# Patient Record
Sex: Male | Born: 1995 | Race: Black or African American | Hispanic: No | Marital: Single | Smoking: Current some day smoker
Health system: Southern US, Community
[De-identification: ages and names within clinical notes are randomized; demographics above are authoritative.]

---
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
## 2013-02-22 IMAGING — CR DG WRIST COMPLETE 3+V*R*
4 series · 4 of 4 positions shown · non-contrast
Comparison: None.

CLINICAL DATA: Right lateral wrist pain.

RIGHT WRIST - COMPLETE 3+ VIEW

[x wrist pa right]
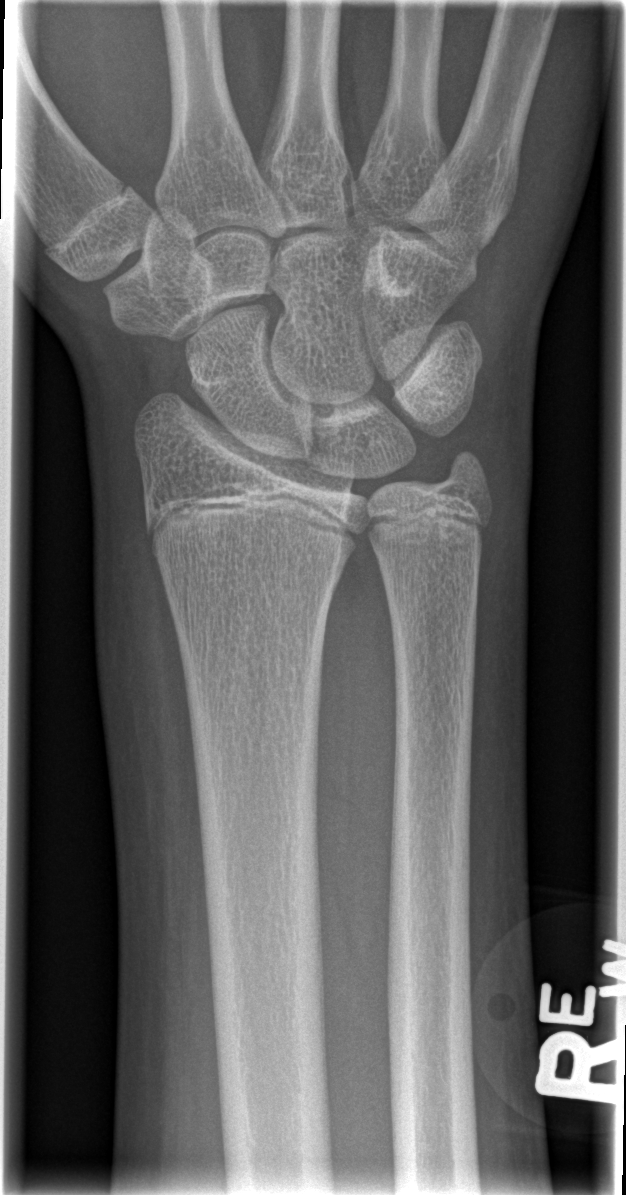

[x wrist obl right]
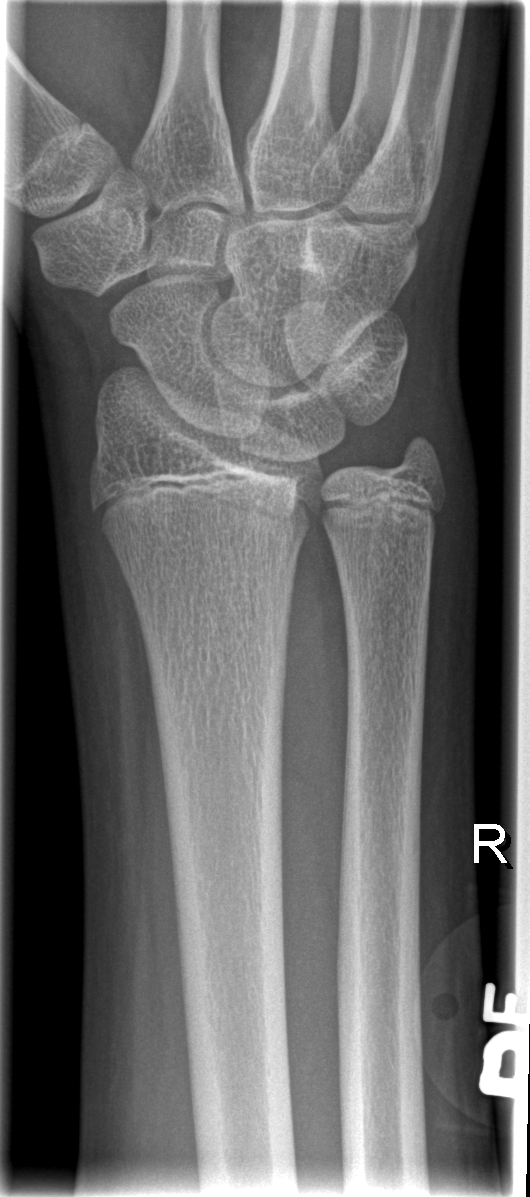

[x wrist lat right]
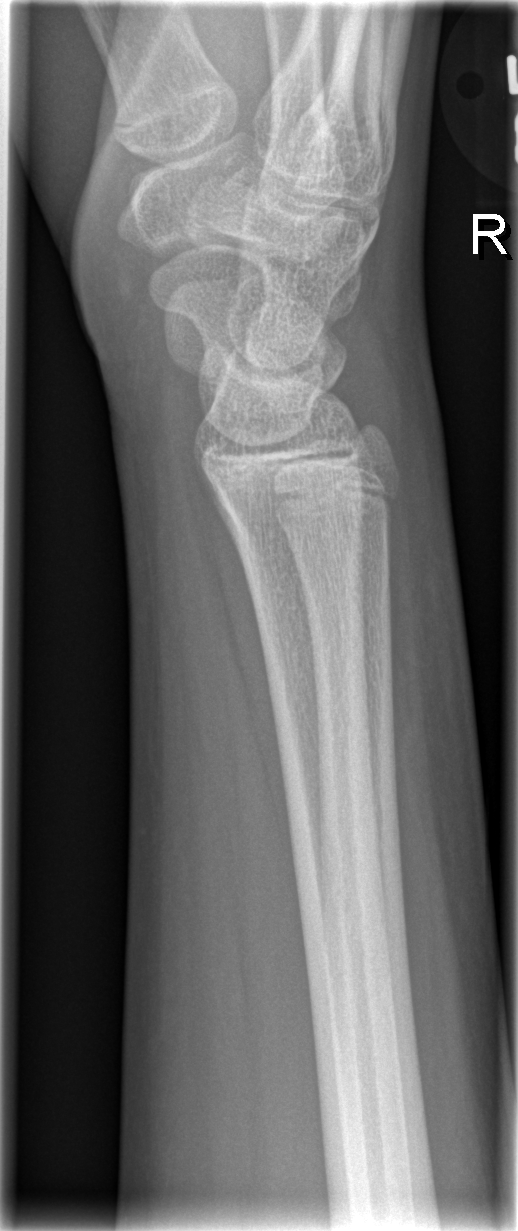

[x navicular]
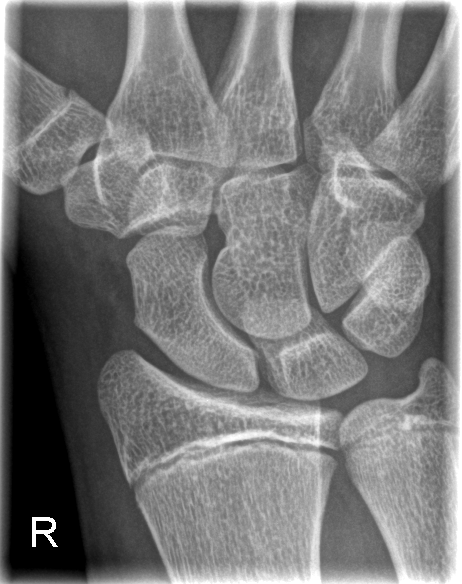

[4 of 4 positions shown; findings below may reference images not displayed]

FINDINGS: No acute osseous or joint abnormality.
IMPRESSION: No acute osseous or joint abnormality.

## 2015-06-21 ENCOUNTER — Emergency Department (HOSPITAL_COMMUNITY)
Admission: EM | Admit: 2015-06-21 | Discharge: 2015-06-21 | Disposition: A | Discharge status: Home / Self Care | Payer: Medicaid Other | Attending: Emergency Medicine | Admitting: Emergency Medicine

## 2015-06-21 ENCOUNTER — Encounter (HOSPITAL_COMMUNITY): Payer: Self-pay

## 2015-06-21 DIAGNOSIS — Z711 Person with feared health complaint in whom no diagnosis is made: Secondary | ICD-10-CM

## 2015-06-21 DIAGNOSIS — F17200 Nicotine dependence, unspecified, uncomplicated: Secondary | ICD-10-CM | POA: Insufficient documentation

## 2015-06-21 DIAGNOSIS — Z202 Contact with and (suspected) exposure to infections with a predominantly sexual mode of transmission: Secondary | ICD-10-CM | POA: Insufficient documentation

## 2015-06-21 DIAGNOSIS — R369 Urethral discharge, unspecified: Secondary | ICD-10-CM

## 2015-06-21 LAB — HIV ANTIBODY (ROUTINE TESTING W REFLEX): HIV Screen 4th Generation wRfx: NONREACTIVE

## 2015-06-21 MED ORDER — CEFTRIAXONE SODIUM 250 MG IJ SOLR
250.0000 mg | Freq: Once | INTRAMUSCULAR | Status: AC
  Administered 2015-06-21: 250 mg via INTRAMUSCULAR
  Filled 2015-06-21: qty 250

## 2015-06-21 MED ORDER — AZITHROMYCIN 250 MG PO TABS
1000.0000 mg | ORAL_TABLET | Freq: Once | ORAL | Status: AC
  Administered 2015-06-21: 1000 mg via ORAL
  Filled 2015-06-21: qty 4

## 2015-06-21 MED ORDER — LIDOCAINE HCL (PF) 1 % IJ SOLN
5.0000 mL | Freq: Once | INTRAMUSCULAR | Status: AC
  Administered 2015-06-21: 2 mL via INTRADERMAL
  Filled 2015-06-21: qty 5
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
## 2015-06-21 NOTE — Discharge Instructions (Signed)
1. Medications: usual home medications °2. Treatment: rest, drink plenty of fluids, use a condom with every sexual encounter °3. Follow Up: Please followup with your primary doctor in 3 days for discussion of your diagnoses and further evaluation after today's visit; if you do not have a primary care doctor use the resource guide provided to find one; Please return to the ER for worsening symptoms, high fevers or persistent vomiting. ° °You have been tested for HIV, syphilis, chlamydia and gonorrhea.  These results will be available in approximately 3 days.  Please inform all sexual partners if you test positive for any of these diseases. ° °

## 2015-06-23 LAB — GC/CHLAMYDIA PROBE AMP (~~LOC~~) NOT AT ARMC
CHLAMYDIA, DNA PROBE: POSITIVE — AB
NEISSERIA GONORRHEA: POSITIVE — AB

## 2015-07-09 ENCOUNTER — Telehealth (HOSPITAL_BASED_OUTPATIENT_CLINIC_OR_DEPARTMENT_OTHER): Payer: Self-pay | Admitting: Emergency Medicine

## 2015-07-09 NOTE — Telephone Encounter (Signed)
Lost to followup
